# Patient Record
Sex: Male | Born: 1965
Health system: Southern US, Community
[De-identification: ages and names within clinical notes are randomized; demographics above are authoritative.]

## PROBLEM LIST (undated history)

## (undated) DIAGNOSIS — C801 Malignant (primary) neoplasm, unspecified: Secondary | ICD-10-CM

## (undated) DIAGNOSIS — K219 Gastro-esophageal reflux disease without esophagitis: Secondary | ICD-10-CM

## (undated) DIAGNOSIS — Z9889 Other specified postprocedural states: Secondary | ICD-10-CM

## (undated) DIAGNOSIS — E119 Type 2 diabetes mellitus without complications: Secondary | ICD-10-CM

## (undated) DIAGNOSIS — T7840XA Allergy, unspecified, initial encounter: Secondary | ICD-10-CM

## (undated) DIAGNOSIS — R112 Nausea with vomiting, unspecified: Secondary | ICD-10-CM

## (undated) HISTORY — PX: TONSILLECTOMY: SUR1361

## (undated) HISTORY — DX: Other specified postprocedural states: Z98.890

## (undated) HISTORY — DX: Type 2 diabetes mellitus without complications: E11.9

## (undated) HISTORY — DX: Malignant (primary) neoplasm, unspecified: C80.1

## (undated) HISTORY — DX: Nausea with vomiting, unspecified: R11.2

## (undated) HISTORY — PX: SINUS EXPLORATION: SHX5214

## (undated) HISTORY — DX: Allergy, unspecified, initial encounter: T78.40XA

---

## 2002-02-19 ENCOUNTER — Ambulatory Visit (HOSPITAL_BASED_OUTPATIENT_CLINIC_OR_DEPARTMENT_OTHER): Admission: RE | Admit: 2002-02-19 | Discharge: 2002-02-19 | Payer: Self-pay | Admitting: Otolaryngology

## 2003-04-19 ENCOUNTER — Ambulatory Visit (HOSPITAL_COMMUNITY): Admission: RE | Admit: 2003-04-19 | Discharge: 2003-04-19 | Payer: Self-pay | Admitting: Family Medicine

## 2003-04-19 ENCOUNTER — Encounter: Payer: Self-pay | Admitting: Family Medicine

## 2003-10-15 ENCOUNTER — Ambulatory Visit (HOSPITAL_COMMUNITY): Admission: RE | Admit: 2003-10-15 | Discharge: 2003-10-15 | Payer: Self-pay | Admitting: Family Medicine

## 2007-03-07 ENCOUNTER — Ambulatory Visit: Payer: Self-pay | Admitting: Internal Medicine

## 2007-06-03 ENCOUNTER — Ambulatory Visit (HOSPITAL_COMMUNITY): Admission: RE | Admit: 2007-06-03 | Discharge: 2007-06-03 | Payer: Self-pay | Admitting: Internal Medicine

## 2007-06-03 ENCOUNTER — Encounter (INDEPENDENT_AMBULATORY_CARE_PROVIDER_SITE_OTHER): Payer: Self-pay | Admitting: Interventional Radiology

## 2008-07-10 IMAGING — US US BIOPSY
1 series · 4 of 4 positions shown · non-contrast
Comparison: none

CLINICAL DATA: Elevated liver function tests.  Request has been made for random liver core biopsy.
ULTRASOUND-GUIDED RANDOM LIVER CORE BIOPSY ? 06/03/07: 
Procedure: The procedure in detail was discussed with the patient and his questions answered.  Potential complications including that of the risk of infection, bleeding, organ damage, and death were discussed with the patient?s apparent understanding.  Written consent was obtained. 
Ultrasound was used to mark an appropriate skin site. The patient was prepped and draped in the normal sterile fashion and 1% lidocaine was used for local anesthesia.  Through a 17-gauge guiding trocar, three passes were made into the right hepatic lobe with an 18-gauge biopsy needle.  The specimens were sent to the laboratory for further analysis.  Post-imaging of the liver revealed no evidence of bleeding or hematoma.  The patient appeared to tolerate the procedure well with no immediate complications. Cardiac and respiratory monitoring were provided by the radiology nurse throughout the procedure.  Moderate sedation in the form of 3 mg of Versed and 100 mcg of fentanyl was administered over a total sedation time of 15 minutes.

[Series 1: unknown · 0.33mm/px · 4 of 4 slices shown]
[im 1/4]
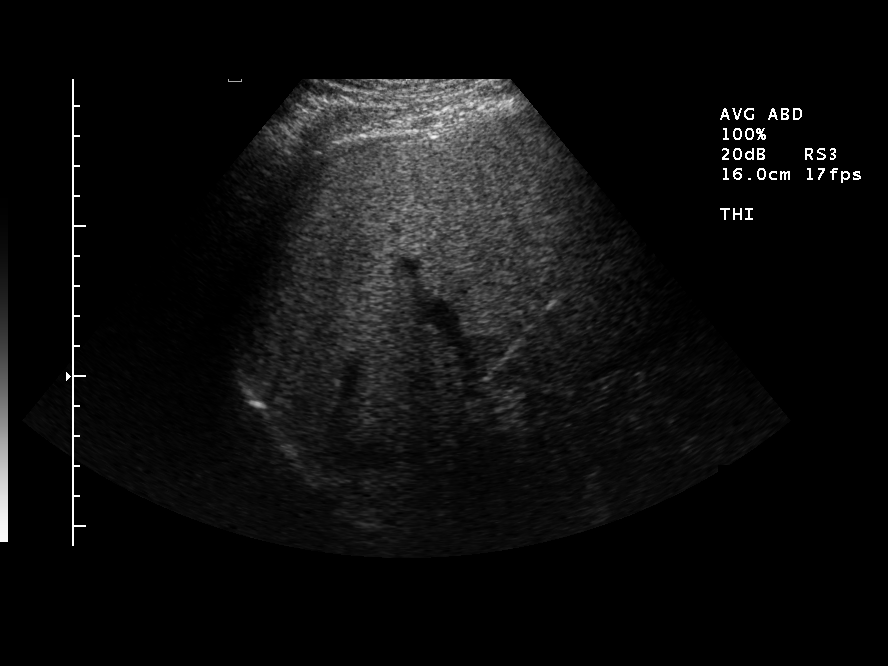
[im 2/4]
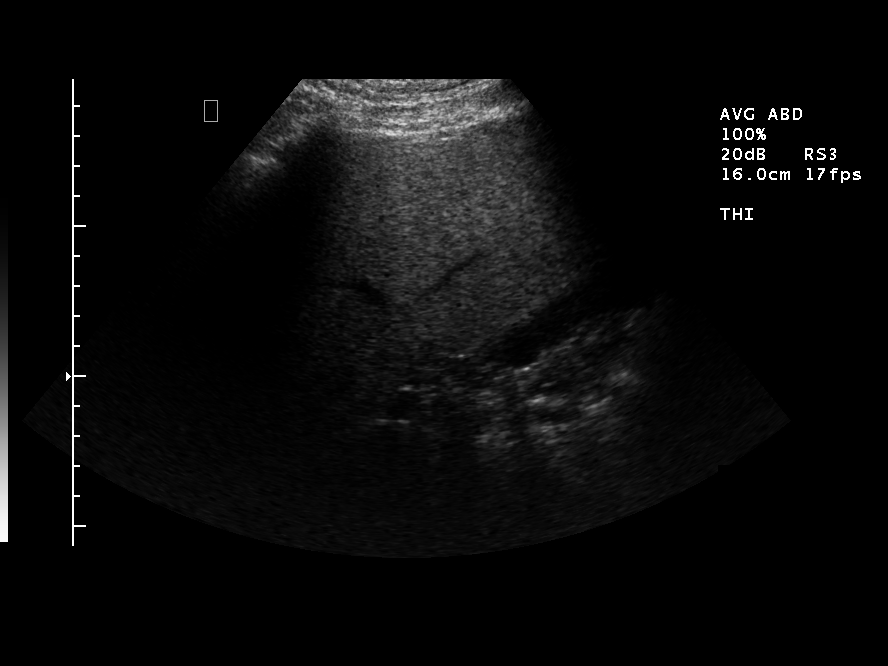
[im 3/4]
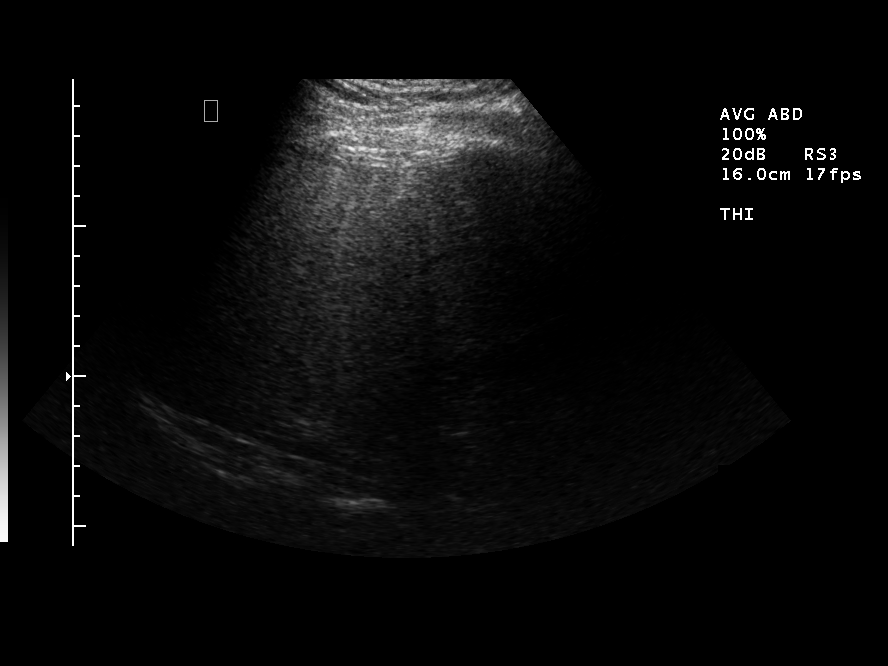
[im 4/4]
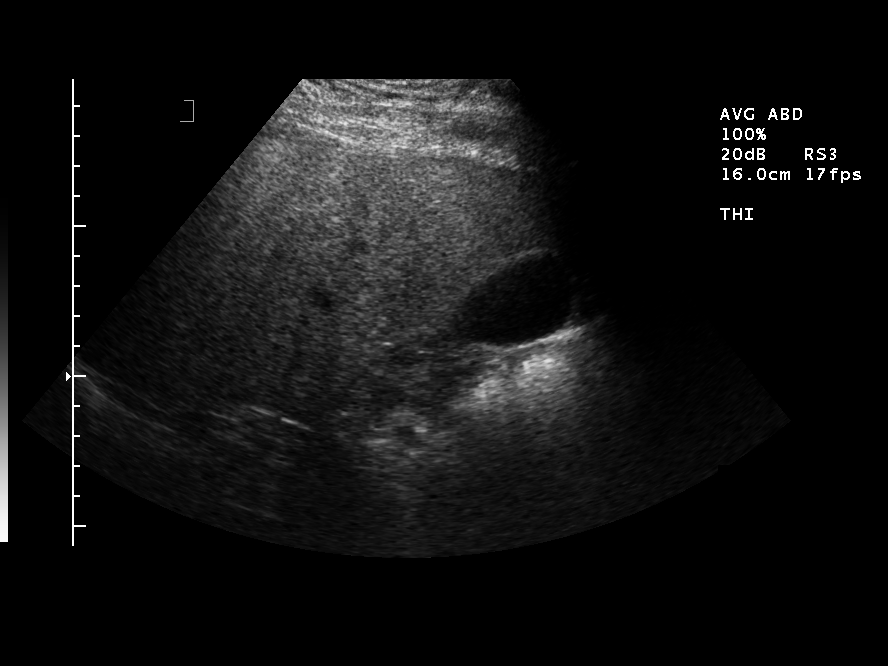

[4 of 4 positions shown; findings below may reference images not displayed]

IMPRESSION: Successful random liver core biopsy under ultrasound guidance.

## 2010-10-20 ENCOUNTER — Encounter: Payer: Self-pay | Admitting: Internal Medicine

## 2011-02-10 NOTE — Consult Note (Signed)
[name], Keith Lam               ACCOUNT NO.:  [id number]   MEDICAL RECORD NO.:  [id number]          PATIENT TYPE:  AMB   LOCATION:                                FACILITY:  [facility]   PHYSICIAN:  R. Roetta Sessions, M.D. DATE OF BIRTH:  [birth year]   DATE OF CONSULTATION:  DATE OF DISCHARGE:                                 CONSULTATION   REFERRING PHYSICIAN:  Patrica Duel, M.D. [facility] [place], [place]).   REASON FOR CONSULTATION:  Elevated liver enzymes.   HISTORY OF PRESENT ILLNESS:  Keith Lam is a very pleasant 45-  year-old Caucasian male sent over by Dr. Nobie Putnam and associates to  further evaluate elevated liver enzymes.  Keith Lam tells me  that he has had elevations in his liver enzymes going back five years.  He tells me every time he is put on a lipid lowering agent, they would  go up even higher and the lipid lowering agent is stopped.   From Feb 02, 2007, his ALT and AST were 61 and 40, respectively.  Alkaline phosphatase 82, total bilirubin 0.6, GGT was slightly up at  112.  Hepatitis B surface antigen and hepatitis C antibody both came  back negative.  He has not had any iron studies.   Keith Lam readily admits drinking 2-3 beers daily.  He has done so for  several years, may drink 5-6 each weekend day and tells me over the past  few months he has had a habit of going out with his friends and drinking  2-3 shots of hard liquor on top of the beer.  The reason for his recent  visit to Dr. Nobie Putnam was some retroxiphoid and right upper quadrant  pain he was having.  He stopped taking the shots and that pain has  subsided at least temporarily with a course of omeprazole 20 mg orally  daily.  He is not having much in the way of typical reflux symptoms. No  odynophagia, no dysphagia, no melena, no rectal bleeding.  He denies  abdominal pain currently.  He has not lost any weight.   There is no history of yellow jaundice.  There was no family history of  chronic liver disease with cirrhosis.  He was diagnosed with colorectal  cancer at age 12.  He does not use over-the-counter herbal remedies.  He  does not use acetaminophen products, when specifically questioned.   He had an ultrasound at [facility] which demonstrated a fatty  liver.   PAST MEDICAL HISTORY:  1. Elevated aminotransferases.  2. History of seasonal allergies.   PAST SURGICAL HISTORY:  1. Tonsillectomy.  2. Sinus surgery.   CURRENT MEDICATIONS:  1. Omeprazole 20 mg orally daily.  2. Allegra 180 mg daily.   ALLERGIES:  NO KNOWN DRUG ALLERGIES.   FAMILY HISTORY:  No chronic GI or liver illness aside from a second  degree relative (aunt) with colorectal cancer.   SOCIAL HISTORY:  Patient is married.  He has one child in good health.  He works in the [employer] as a Engineer, maintenance.  He does not  use  tobacco.  He consumes alcohol as outlined above.  No illicit drugs.   REVIEW OF SYSTEMS:  As in history of present illness, he has occasional  alternating constipation and diarrhea.  No melena, no rectal bleeding,  no yellow jaundice, clay colored stools, dark colored urine.   PHYSICAL EXAMINATION:  GENERAL APPEARANCE:  A 45 year old gentleman,  well groomed, resting comfortably.  VITAL SIGNS:  Weight 184.5, height 5 feet 9, temperature 98, blood  pressure 124/90, pulse 72.  SKIN:  Warm and dry.  He has some skin tanning.  HEENT:  He has a couple of small malar telangiectasias, no scleral  icterus.  Conjunctivae are pink.  Oral cavity no lesions.  CHEST:  Lungs are clear to auscultation.  CARDIOVASCULAR:  Regular rate and rhythm without murmurs, rubs, or  gallops.  ABDOMEN:  Nondistended.  Positive bowel sounds.  Soft and nontender  without appreciable mass or hepatosplenomegaly.  EXTREMITIES:  No edema.   ADDITIONAL LABORATORY DATA:  CBC was completely normal from Feb 02, 2007, white count 5, H&H 15.5 and 45.9, platelet count 199.  Chem-20  looked good  except for AST and ALT of 40 and 61, respectively.  Albumin  was 4.7.   IMPRESSION:  Keith Lam is a pleasant 45 year old Caucasian male  referred over for elevated aminotransferases.  I suspect almost  certainly, this noted elevation in his aminotransferases is related to  rather significant ongoing alcohol consumption.  By his history, he  gives a report of even a greater jump when Statin therapies are  employed.   I discussed with Keith Lam, the high likelihood the level of alcohol  consumption that is ongoing could well be harming his liver and  producing the elevated blood enzyme levels.   RECOMMENDATIONS:  Would like to retrieve the ultrasound report from  [facility] which I do not have for review at this time.  Will  repeat his LFTs, obtain a protime as a baseline at this time and will  also do iron studies to rule out the remote possibility of coexisting  iron overload.  I told Keith Lam assuming that his iron studies are  okay, I have recommended he completely refrain from all forms of alcohol  for the next six weeks and then we would repeat his liver enzymes.   If we could establish that his elevated liver enzymes are currently  related to alcohol consumption that would potentially allow [place] to  consider Statin therapy in the future, even allowing for some degree of  elevation in transaminases assuming alcohol would be out of the picture.   I would like to thank Dr. Patrica Duel for allowing me to see this very  nice gentleman today.      Keith Lam, M.D.  Electronically Signed     RMR/MEDQ  D:  03/07/2007  T:  03/07/2007  Job:  [id number]   cc:   Patrica Duel, M.D.  Fax: [phone]

## 2011-07-10 LAB — CBC
MCHC: 35.4
MCV: 90.7
Platelets: 205

## 2011-07-10 LAB — PROTIME-INR: Prothrombin Time: 12.8

## 2012-02-21 ENCOUNTER — Encounter: Payer: Self-pay | Admitting: Emergency Medicine

## 2012-02-21 ENCOUNTER — Emergency Department
Admission: EM | Admit: 2012-02-21 | Discharge: 2012-02-21 | Disposition: A | Discharge status: Home / Self Care | Payer: BC Managed Care – PPO | Source: Home / Self Care | Attending: Family Medicine | Admitting: Family Medicine

## 2012-02-21 DIAGNOSIS — J029 Acute pharyngitis, unspecified: Secondary | ICD-10-CM

## 2012-02-21 DIAGNOSIS — K120 Recurrent oral aphthae: Secondary | ICD-10-CM

## 2012-02-21 HISTORY — DX: Gastro-esophageal reflux disease without esophagitis: K21.9

## 2012-02-21 MED ORDER — MAGIC MOUTHWASH W/LIDOCAINE: ORAL | Status: DC

## 2012-02-21 MED ORDER — HYDROCODONE-ACETAMINOPHEN 5-500 MG PO TABS: ORAL_TABLET | ORAL | Status: DC

## 2012-02-21 NOTE — ED Provider Notes (Signed)
History     CSN: [id number]  Arrival date & time 02/21/12  1421   First MD Initiated Contact with Patient 02/21/12 1426      Chief Complaint  Patient presents with  . Sore Throat     HPI Comments: Patient complains of onset of a mild sore throat four days ago, with chills at night.  The sore throat gradually worsened and he visited an urgent care center two days ago where he was prescribed amoxicillin.  A rapid strep test was negative.  He continues to have a sore throat, which awakens him at night, and he has noticed two small ulcers on his uvula.   He feels generally well otherwise except for fatigue.                                                                                                                                                                                                                                                                                                                          The history is provided by the patient.    Past Medical History  Diagnosis Date  . Acid reflux     Past Surgical History  Procedure Date  . Sinus exploration     History reviewed. No pertinent family history.  History  Substance Use Topics  . Smoking status: Never Smoker   . Smokeless tobacco: Not on file  . Alcohol Use: Yes      Review of Systems + sore throat No cough No pleuritic pain No wheezing No nasal congestion No post-nasal drainage No sinus pain/pressure No itchy/red eyes ? earache No hemoptysis No SOB No fever, + chills No nausea No vomiting No abdominal pain No diarrhea No urinary symptoms No skin rashes + fatigue No myalgias No headache Used OTC meds without relief (ibuprofen) Allergies  Sulfa antibiotics  Home Medications   Current Outpatient Rx  Name Route Sig Dispense Refill  . AMOXICILLIN ER 775 MG PO TB24 Oral Take  1,500 mg by mouth 2 (two) times daily.    Marland Kitchen LORATADINE 10 MG PO TABS Oral Take 10 mg by mouth 2 (two) times  daily.    Marland Kitchen OMEPRAZOLE 20 MG PO CPDR Oral Take 20 mg by mouth daily.    Marland Kitchen MAGIC MOUTHWASH W/LIDOCAINE  Gargle 5mL four times daily (before meals and bedtime), then spit out. 140 mL 0  . HYDROCODONE-ACETAMINOPHEN 5-500 MG PO TABS  Take one or two tabs by mouth at bedtime as needed for pain 10 tablet 0    BP 130/86  Pulse 63  Temp(Src) 98.1 F (36.7 C) (Oral)  Resp 16  Ht 5\' 8"  (1.727 m)  Wt 177 lb (80.287 kg)  BMI 26.91 kg/m2  SpO2 98%  Physical Exam Nursing notes and Vital Signs reviewed. Appearance:  Patient appears healthy, stated age, and in no acute distress Eyes:  Pupils are equal, round, and reactive to light and accomodation.  Extraocular movement is intact.  Conjunctivae are not inflamed  Ears:  Canals normal.  Tympanic membranes normal.  Nose:  No turbinate congestion.  No sinus tenderness.   Pharynx:  There is erythema of the uvula with two small aphthous ulcers present measuring about 2mm dia. Neck:  Supple.  Tender shotty anterior/posterior nodes are palpated bilaterally  Lungs:  Clear to auscultation.  Breath sounds are equal.  Heart:  Regular rate and rhythm without murmurs, rubs, or gallops.  Abdomen:  Nontender without masses or hepatosplenomegaly.  Bowel sounds are present.  No CVA or flank tenderness.  Extremities:  No edema.  No calf tenderness Skin:  No rash present.   ED Course  Procedures none   Labs Reviewed  POCT RAPID STREP A (OFFICE) negative  STREP A DNA PROBE pending      1. Acute pharyngitis; appears viral  2. Aphthous ulcer of pharynx or hypopharynx       MDM   Throat culture pending. Continue amoxicillin. Rx for Lortab at bedtime.  Magic mouthwash four times daily. Followup with ENT if not improving.        Lattie Haw, MD 02/21/12 1700

## 2012-02-21 NOTE — ED Notes (Signed)
Sore throat x 4 days; seen in another urgent care 48 hours ago where rapid strep was negative, but was placed on Amoxicillin; no improvement.

## 2012-02-21 NOTE — ED Notes (Deleted)
Treated/evaluated here 2 days ago for congestion, cough and sore throat. Was told it was 'viral' and no meds.

## 2012-02-21 NOTE — Discharge Instructions (Signed)
May continue amoxicillin.   Viral Pharyngitis Viral pharyngitis is a viral infection that produces redness, pain, and swelling (inflammation) of the throat. It can spread from person to person (contagious). CAUSES Viral pharyngitis is caused by inhaling a large amount of certain germs called viruses. Many different viruses cause viral pharyngitis. SYMPTOMS Symptoms of viral pharyngitis include:  Sore throat.   Tiredness.   Stuffy nose.   Low-grade fever.   Congestion.   Cough.  TREATMENT Treatment includes rest, drinking plenty of fluids, and the use of over-the-counter medication (approved by your caregiver). HOME CARE INSTRUCTIONS   Drink enough fluids to keep your urine clear or pale yellow.   Eat soft, cold foods such as ice cream, frozen ice pops, or gelatin dessert.   Gargle with warm salt water (1 tsp salt per 1 qt of water).   If over age 60, throat lozenges may be used safely.   Only take over-the-counter or prescription medicines for pain, discomfort, or fever as directed by your caregiver. Do not take aspirin.  To help prevent spreading viral pharyngitis to others, avoid:  Mouth-to-mouth contact with others.   Sharing utensils for eating and drinking.   Coughing around others.  SEEK MEDICAL CARE IF:   You are better in a few days, then become worse.   You have a fever or pain not helped by pain medicines.   There are any other changes that concern you.  Document Released: 06/24/2005 Document Revised: 09/03/2011 Document Reviewed: 11/20/2010 [facility] Patient Information 2012 [place], [place].

## 2012-12-07 ENCOUNTER — Other Ambulatory Visit (HOSPITAL_COMMUNITY): Payer: Self-pay | Admitting: Family Medicine

## 2012-12-07 ENCOUNTER — Ambulatory Visit (HOSPITAL_COMMUNITY)
Admission: RE | Admit: 2012-12-07 | Discharge: 2012-12-07 | Disposition: A | Discharge status: Home / Self Care | Payer: BC Managed Care – PPO | Source: Ambulatory Visit | Attending: Family Medicine | Admitting: Family Medicine

## 2012-12-07 DIAGNOSIS — J189 Pneumonia, unspecified organism: Secondary | ICD-10-CM

## 2012-12-07 DIAGNOSIS — R05 Cough: Secondary | ICD-10-CM | POA: Insufficient documentation

## 2012-12-07 DIAGNOSIS — J069 Acute upper respiratory infection, unspecified: Secondary | ICD-10-CM | POA: Insufficient documentation

## 2012-12-07 DIAGNOSIS — R059 Cough, unspecified: Secondary | ICD-10-CM | POA: Insufficient documentation

## 2015-02-15 ENCOUNTER — Other Ambulatory Visit (HOSPITAL_COMMUNITY): Payer: Self-pay | Admitting: Family Medicine

## 2015-02-15 DIAGNOSIS — K219 Gastro-esophageal reflux disease without esophagitis: Secondary | ICD-10-CM

## 2015-02-15 DIAGNOSIS — R1011 Right upper quadrant pain: Secondary | ICD-10-CM

## 2015-02-19 ENCOUNTER — Ambulatory Visit (HOSPITAL_COMMUNITY): Admission: RE | Admit: 2015-02-19 | Payer: BLUE CROSS/BLUE SHIELD | Source: Ambulatory Visit

## 2015-03-07 ENCOUNTER — Encounter (INDEPENDENT_AMBULATORY_CARE_PROVIDER_SITE_OTHER): Payer: Self-pay | Admitting: *Deleted

## 2015-04-08 ENCOUNTER — Ambulatory Visit (INDEPENDENT_AMBULATORY_CARE_PROVIDER_SITE_OTHER): Payer: BLUE CROSS/BLUE SHIELD | Admitting: Internal Medicine

## 2015-04-25 ENCOUNTER — Ambulatory Visit (INDEPENDENT_AMBULATORY_CARE_PROVIDER_SITE_OTHER): Payer: BLUE CROSS/BLUE SHIELD | Admitting: Internal Medicine

## 2016-02-12 DIAGNOSIS — B078 Other viral warts: Secondary | ICD-10-CM | POA: Diagnosis not present

## 2016-02-12 DIAGNOSIS — D225 Melanocytic nevi of trunk: Secondary | ICD-10-CM | POA: Diagnosis not present

## 2016-03-24 DIAGNOSIS — Z008 Encounter for other general examination: Secondary | ICD-10-CM | POA: Diagnosis not present

## 2016-03-24 DIAGNOSIS — Z1389 Encounter for screening for other disorder: Secondary | ICD-10-CM | POA: Diagnosis not present

## 2016-03-24 DIAGNOSIS — R7301 Impaired fasting glucose: Secondary | ICD-10-CM | POA: Diagnosis not present

## 2016-03-24 DIAGNOSIS — E782 Mixed hyperlipidemia: Secondary | ICD-10-CM | POA: Diagnosis not present

## 2016-07-21 DIAGNOSIS — Z008 Encounter for other general examination: Secondary | ICD-10-CM | POA: Diagnosis not present

## 2016-07-21 DIAGNOSIS — E782 Mixed hyperlipidemia: Secondary | ICD-10-CM | POA: Diagnosis not present

## 2016-07-21 DIAGNOSIS — Z139 Encounter for screening, unspecified: Secondary | ICD-10-CM | POA: Diagnosis not present

## 2016-07-21 DIAGNOSIS — J309 Allergic rhinitis, unspecified: Secondary | ICD-10-CM | POA: Diagnosis not present

## 2016-08-11 DIAGNOSIS — E782 Mixed hyperlipidemia: Secondary | ICD-10-CM | POA: Diagnosis not present

## 2016-08-11 DIAGNOSIS — R7989 Other specified abnormal findings of blood chemistry: Secondary | ICD-10-CM | POA: Diagnosis not present

## 2016-08-18 DIAGNOSIS — J3489 Other specified disorders of nose and nasal sinuses: Secondary | ICD-10-CM | POA: Diagnosis not present

## 2016-08-18 DIAGNOSIS — B9789 Other viral agents as the cause of diseases classified elsewhere: Secondary | ICD-10-CM | POA: Diagnosis not present

## 2016-08-18 DIAGNOSIS — J069 Acute upper respiratory infection, unspecified: Secondary | ICD-10-CM | POA: Diagnosis not present

## 2016-12-17 DIAGNOSIS — R7301 Impaired fasting glucose: Secondary | ICD-10-CM | POA: Diagnosis not present

## 2016-12-17 DIAGNOSIS — Z719 Counseling, unspecified: Secondary | ICD-10-CM | POA: Diagnosis not present

## 2016-12-17 DIAGNOSIS — Z008 Encounter for other general examination: Secondary | ICD-10-CM | POA: Diagnosis not present

## 2016-12-17 DIAGNOSIS — E782 Mixed hyperlipidemia: Secondary | ICD-10-CM | POA: Diagnosis not present

## 2016-12-17 DIAGNOSIS — R7989 Other specified abnormal findings of blood chemistry: Secondary | ICD-10-CM | POA: Diagnosis not present

## 2017-01-04 DIAGNOSIS — E782 Mixed hyperlipidemia: Secondary | ICD-10-CM | POA: Diagnosis not present

## 2017-01-04 DIAGNOSIS — Z6827 Body mass index (BMI) 27.0-27.9, adult: Secondary | ICD-10-CM | POA: Diagnosis not present

## 2017-01-04 DIAGNOSIS — Z1389 Encounter for screening for other disorder: Secondary | ICD-10-CM | POA: Diagnosis not present

## 2017-01-04 DIAGNOSIS — R7309 Other abnormal glucose: Secondary | ICD-10-CM | POA: Diagnosis not present

## 2017-01-12 ENCOUNTER — Encounter: Payer: Self-pay | Admitting: Gastroenterology

## 2017-02-26 ENCOUNTER — Ambulatory Visit (AMBULATORY_SURGERY_CENTER): Payer: Self-pay

## 2017-02-26 VITALS — Ht 68.0 in | Wt 174.6 lb

## 2017-02-26 DIAGNOSIS — Z1211 Encounter for screening for malignant neoplasm of colon: Secondary | ICD-10-CM

## 2017-02-26 MED ORDER — SUPREP BOWEL PREP KIT 17.5-3.13-1.6 GM/177ML PO SOLN: 1.0000 | Freq: Once | ORAL | 0 refills | Status: AC

## 2017-02-26 NOTE — Progress Notes (Signed)
No allergies to eggs or soy No diet meds No home oxygen No past problems with anesthesia  Registered emmi 

## 2017-03-02 ENCOUNTER — Encounter: Payer: Self-pay | Admitting: Gastroenterology

## 2017-03-12 ENCOUNTER — Encounter: Payer: BLUE CROSS/BLUE SHIELD | Admitting: Gastroenterology

## 2017-04-02 DIAGNOSIS — Z1211 Encounter for screening for malignant neoplasm of colon: Secondary | ICD-10-CM | POA: Diagnosis not present

## 2017-04-06 DIAGNOSIS — R7309 Other abnormal glucose: Secondary | ICD-10-CM | POA: Diagnosis not present

## 2017-04-06 DIAGNOSIS — E663 Overweight: Secondary | ICD-10-CM | POA: Diagnosis not present

## 2017-04-06 DIAGNOSIS — Z6825 Body mass index (BMI) 25.0-25.9, adult: Secondary | ICD-10-CM | POA: Diagnosis not present

## 2017-04-06 DIAGNOSIS — E782 Mixed hyperlipidemia: Secondary | ICD-10-CM | POA: Diagnosis not present

## 2017-04-06 DIAGNOSIS — Z1389 Encounter for screening for other disorder: Secondary | ICD-10-CM | POA: Diagnosis not present

## 2017-05-02 DIAGNOSIS — S0181XA Laceration without foreign body of other part of head, initial encounter: Secondary | ICD-10-CM | POA: Diagnosis not present

## 2017-06-08 DIAGNOSIS — E782 Mixed hyperlipidemia: Secondary | ICD-10-CM | POA: Diagnosis not present

## 2017-06-08 DIAGNOSIS — Z139 Encounter for screening, unspecified: Secondary | ICD-10-CM | POA: Diagnosis not present

## 2017-06-08 DIAGNOSIS — R7301 Impaired fasting glucose: Secondary | ICD-10-CM | POA: Diagnosis not present

## 2017-07-08 DIAGNOSIS — Z008 Encounter for other general examination: Secondary | ICD-10-CM | POA: Diagnosis not present

## 2017-07-08 DIAGNOSIS — R7301 Impaired fasting glucose: Secondary | ICD-10-CM | POA: Diagnosis not present

## 2017-07-08 DIAGNOSIS — Z719 Counseling, unspecified: Secondary | ICD-10-CM | POA: Diagnosis not present

## 2017-07-08 DIAGNOSIS — R7989 Other specified abnormal findings of blood chemistry: Secondary | ICD-10-CM | POA: Diagnosis not present

## 2017-07-08 DIAGNOSIS — E782 Mixed hyperlipidemia: Secondary | ICD-10-CM | POA: Diagnosis not present

## 2017-08-03 DIAGNOSIS — R7301 Impaired fasting glucose: Secondary | ICD-10-CM | POA: Diagnosis not present

## 2017-08-03 DIAGNOSIS — E782 Mixed hyperlipidemia: Secondary | ICD-10-CM | POA: Diagnosis not present

## 2017-08-03 DIAGNOSIS — R7989 Other specified abnormal findings of blood chemistry: Secondary | ICD-10-CM | POA: Diagnosis not present

## 2017-08-12 DIAGNOSIS — Z008 Encounter for other general examination: Secondary | ICD-10-CM | POA: Diagnosis not present

## 2017-09-03 DIAGNOSIS — J40 Bronchitis, not specified as acute or chronic: Secondary | ICD-10-CM | POA: Diagnosis not present

## 2017-09-03 DIAGNOSIS — Z6828 Body mass index (BMI) 28.0-28.9, adult: Secondary | ICD-10-CM | POA: Diagnosis not present

## 2017-09-03 DIAGNOSIS — J329 Chronic sinusitis, unspecified: Secondary | ICD-10-CM | POA: Diagnosis not present

## 2017-11-30 DIAGNOSIS — R7989 Other specified abnormal findings of blood chemistry: Secondary | ICD-10-CM | POA: Diagnosis not present

## 2017-11-30 DIAGNOSIS — Z008 Encounter for other general examination: Secondary | ICD-10-CM | POA: Diagnosis not present

## 2017-11-30 DIAGNOSIS — E782 Mixed hyperlipidemia: Secondary | ICD-10-CM | POA: Diagnosis not present

## 2017-11-30 DIAGNOSIS — Z719 Counseling, unspecified: Secondary | ICD-10-CM | POA: Diagnosis not present

## 2018-03-09 DIAGNOSIS — E663 Overweight: Secondary | ICD-10-CM | POA: Diagnosis not present

## 2018-03-09 DIAGNOSIS — Z0000 Encounter for general adult medical examination without abnormal findings: Secondary | ICD-10-CM | POA: Diagnosis not present

## 2018-03-09 DIAGNOSIS — R7309 Other abnormal glucose: Secondary | ICD-10-CM | POA: Diagnosis not present

## 2018-03-09 DIAGNOSIS — R002 Palpitations: Secondary | ICD-10-CM | POA: Diagnosis not present

## 2018-03-09 DIAGNOSIS — K219 Gastro-esophageal reflux disease without esophagitis: Secondary | ICD-10-CM | POA: Diagnosis not present

## 2018-03-09 DIAGNOSIS — Z6827 Body mass index (BMI) 27.0-27.9, adult: Secondary | ICD-10-CM | POA: Diagnosis not present

## 2018-03-09 DIAGNOSIS — E782 Mixed hyperlipidemia: Secondary | ICD-10-CM | POA: Diagnosis not present

## 2018-03-09 DIAGNOSIS — Z1389 Encounter for screening for other disorder: Secondary | ICD-10-CM | POA: Diagnosis not present

## 2018-03-09 DIAGNOSIS — Z23 Encounter for immunization: Secondary | ICD-10-CM | POA: Diagnosis not present

## 2018-03-15 DIAGNOSIS — E782 Mixed hyperlipidemia: Secondary | ICD-10-CM | POA: Diagnosis not present

## 2018-03-15 DIAGNOSIS — R7989 Other specified abnormal findings of blood chemistry: Secondary | ICD-10-CM | POA: Diagnosis not present

## 2018-03-15 DIAGNOSIS — Z008 Encounter for other general examination: Secondary | ICD-10-CM | POA: Diagnosis not present

## 2018-03-15 DIAGNOSIS — Z719 Counseling, unspecified: Secondary | ICD-10-CM | POA: Diagnosis not present

## 2018-03-24 DIAGNOSIS — A059 Bacterial foodborne intoxication, unspecified: Secondary | ICD-10-CM | POA: Diagnosis not present

## 2018-03-24 DIAGNOSIS — Z6827 Body mass index (BMI) 27.0-27.9, adult: Secondary | ICD-10-CM | POA: Diagnosis not present

## 2018-03-24 DIAGNOSIS — E663 Overweight: Secondary | ICD-10-CM | POA: Diagnosis not present

## 2018-04-15 DIAGNOSIS — S93402A Sprain of unspecified ligament of left ankle, initial encounter: Secondary | ICD-10-CM | POA: Diagnosis not present

## 2018-07-11 DIAGNOSIS — Z008 Encounter for other general examination: Secondary | ICD-10-CM | POA: Diagnosis not present

## 2018-08-08 DIAGNOSIS — R52 Pain, unspecified: Secondary | ICD-10-CM | POA: Diagnosis not present

## 2018-08-08 DIAGNOSIS — W57XXXA Bitten or stung by nonvenomous insect and other nonvenomous arthropods, initial encounter: Secondary | ICD-10-CM | POA: Diagnosis not present

## 2018-08-24 DIAGNOSIS — R21 Rash and other nonspecific skin eruption: Secondary | ICD-10-CM | POA: Diagnosis not present

## 2018-08-24 DIAGNOSIS — Z1389 Encounter for screening for other disorder: Secondary | ICD-10-CM | POA: Diagnosis not present

## 2018-08-24 DIAGNOSIS — Z6827 Body mass index (BMI) 27.0-27.9, adult: Secondary | ICD-10-CM | POA: Diagnosis not present

## 2018-08-24 DIAGNOSIS — E663 Overweight: Secondary | ICD-10-CM | POA: Diagnosis not present

## 2018-08-24 DIAGNOSIS — T07XXXA Unspecified multiple injuries, initial encounter: Secondary | ICD-10-CM | POA: Diagnosis not present

## 2018-08-24 DIAGNOSIS — Z23 Encounter for immunization: Secondary | ICD-10-CM | POA: Diagnosis not present

## 2019-05-22 DIAGNOSIS — K219 Gastro-esophageal reflux disease without esophagitis: Secondary | ICD-10-CM | POA: Diagnosis not present

## 2019-05-22 DIAGNOSIS — R7309 Other abnormal glucose: Secondary | ICD-10-CM | POA: Diagnosis not present

## 2019-05-22 DIAGNOSIS — E782 Mixed hyperlipidemia: Secondary | ICD-10-CM | POA: Diagnosis not present

## 2019-05-24 DIAGNOSIS — Z1389 Encounter for screening for other disorder: Secondary | ICD-10-CM | POA: Diagnosis not present

## 2019-05-24 DIAGNOSIS — E1165 Type 2 diabetes mellitus with hyperglycemia: Secondary | ICD-10-CM | POA: Diagnosis not present

## 2019-05-24 DIAGNOSIS — E7849 Other hyperlipidemia: Secondary | ICD-10-CM | POA: Diagnosis not present

## 2019-05-24 DIAGNOSIS — R7309 Other abnormal glucose: Secondary | ICD-10-CM | POA: Diagnosis not present

## 2019-05-24 DIAGNOSIS — Z0001 Encounter for general adult medical examination with abnormal findings: Secondary | ICD-10-CM | POA: Diagnosis not present

## 2019-05-24 DIAGNOSIS — Z6826 Body mass index (BMI) 26.0-26.9, adult: Secondary | ICD-10-CM | POA: Diagnosis not present

## 2019-05-24 DIAGNOSIS — R1011 Right upper quadrant pain: Secondary | ICD-10-CM | POA: Diagnosis not present

## 2019-05-25 ENCOUNTER — Other Ambulatory Visit (HOSPITAL_COMMUNITY): Payer: Self-pay | Admitting: Family Medicine

## 2019-05-25 ENCOUNTER — Other Ambulatory Visit: Payer: Self-pay | Admitting: Family Medicine

## 2019-05-25 DIAGNOSIS — R1011 Right upper quadrant pain: Secondary | ICD-10-CM

## 2019-06-02 ENCOUNTER — Ambulatory Visit (HOSPITAL_COMMUNITY)
Admission: RE | Admit: 2019-06-02 | Discharge: 2019-06-02 | Disposition: A | Discharge status: Home / Self Care | Payer: BC Managed Care – PPO | Source: Ambulatory Visit | Attending: Family Medicine | Admitting: Family Medicine

## 2019-06-02 ENCOUNTER — Other Ambulatory Visit: Payer: Self-pay

## 2019-06-02 DIAGNOSIS — R1011 Right upper quadrant pain: Secondary | ICD-10-CM

## 2019-07-10 DIAGNOSIS — Z23 Encounter for immunization: Secondary | ICD-10-CM | POA: Diagnosis not present

## 2019-07-14 ENCOUNTER — Encounter: Payer: Self-pay | Admitting: Internal Medicine

## 2019-08-21 DIAGNOSIS — J329 Chronic sinusitis, unspecified: Secondary | ICD-10-CM | POA: Diagnosis not present

## 2019-11-15 DIAGNOSIS — D225 Melanocytic nevi of trunk: Secondary | ICD-10-CM | POA: Diagnosis not present

## 2019-11-15 DIAGNOSIS — Z1283 Encounter for screening for malignant neoplasm of skin: Secondary | ICD-10-CM | POA: Diagnosis not present

## 2019-11-15 DIAGNOSIS — D044 Carcinoma in situ of skin of scalp and neck: Secondary | ICD-10-CM | POA: Diagnosis not present

## 2019-12-01 ENCOUNTER — Encounter: Payer: Self-pay | Admitting: Gastroenterology

## 2019-12-07 ENCOUNTER — Ambulatory Visit: Payer: Self-pay | Attending: Internal Medicine

## 2019-12-07 DIAGNOSIS — Z23 Encounter for immunization: Secondary | ICD-10-CM

## 2019-12-07 NOTE — Progress Notes (Signed)
   Covid-19 Vaccination Clinic  Name:  Keith Lam    MRN: [id number] DOB: 02-26-1966  12/07/2019  [name] was observed post Covid-19 immunization for 15 minutes without incident. He was provided with Vaccine Information Sheet and instruction to access the V-Safe system.   [name] was instructed to call 911 with any severe reactions post vaccine: Marland Kitchen Difficulty breathing  . Swelling of face and throat  . A fast heartbeat  . A bad rash all over body  . Dizziness and weakness   Immunizations Administered    Name Date Dose VIS Date Route   Moderna COVID-19 Vaccine 12/07/2019 10:07 AM 0.5 mL 08/29/2019 Intramuscular   Manufacturer: Moderna   Lot: [id number]   [place]:[id number]

## 2019-12-21 DIAGNOSIS — Z85828 Personal history of other malignant neoplasm of skin: Secondary | ICD-10-CM | POA: Diagnosis not present

## 2019-12-21 DIAGNOSIS — L928 Other granulomatous disorders of the skin and subcutaneous tissue: Secondary | ICD-10-CM | POA: Diagnosis not present

## 2019-12-21 DIAGNOSIS — Z08 Encounter for follow-up examination after completed treatment for malignant neoplasm: Secondary | ICD-10-CM | POA: Diagnosis not present

## 2020-01-03 DIAGNOSIS — E119 Type 2 diabetes mellitus without complications: Secondary | ICD-10-CM | POA: Insufficient documentation

## 2020-01-03 DIAGNOSIS — K219 Gastro-esophageal reflux disease without esophagitis: Secondary | ICD-10-CM | POA: Insufficient documentation

## 2020-01-09 ENCOUNTER — Ambulatory Visit: Payer: BC Managed Care – PPO | Attending: Internal Medicine

## 2020-01-09 DIAGNOSIS — Z23 Encounter for immunization: Secondary | ICD-10-CM

## 2020-01-09 NOTE — Progress Notes (Signed)
   Covid-19 Vaccination Clinic  Name:  Keith Lam    MRN: [id number] DOB: 10/29/1965  01/09/2020  Keith Lam was observed post Covid-19 immunization for 15 minutes without incident. He was provided with Vaccine Information Sheet and instruction to access the V-Safe system.   Keith Lam was instructed to call 911 with any severe reactions post vaccine: Marland Kitchen Difficulty breathing  . Swelling of face and throat  . A fast heartbeat  . A bad rash all over body  . Dizziness and weakness   Immunizations Administered    Name Date Dose VIS Date Route   Moderna COVID-19 Vaccine 01/09/2020  8:54 AM 0.5 mL 08/29/2019 Intramuscular   Manufacturer: Moderna   Lot: [id number]   [id number]

## 2020-01-12 ENCOUNTER — Ambulatory Visit (AMBULATORY_SURGERY_CENTER): Payer: Self-pay | Admitting: *Deleted

## 2020-01-12 ENCOUNTER — Other Ambulatory Visit: Payer: Self-pay

## 2020-01-12 VITALS — Temp 96.9°F | Ht 69.0 in | Wt 179.6 lb

## 2020-01-12 DIAGNOSIS — E119 Type 2 diabetes mellitus without complications: Secondary | ICD-10-CM

## 2020-01-12 DIAGNOSIS — Z1211 Encounter for screening for malignant neoplasm of colon: Secondary | ICD-10-CM

## 2020-01-12 MED ORDER — SUTAB 1479-225-188 MG PO TABS: 24.0000 | ORAL_TABLET | ORAL | 0 refills | Status: DC

## 2020-01-12 NOTE — Progress Notes (Signed)
01-09-20 completed covid vaccines  No egg or soy allergy known to patient   issues with past sedation with any surgeries  or procedures- PONV with deviated septum surgery , no intubation problems  No diet pills per patient No home 02 use per patient  No blood thinners per patient  Pt denies issues with constipation  No A fib or A flutter  EMMI video sent to pt's e mail   Due to the COVID-19 pandemic we are asking patients to follow these guidelines. Please only bring one care partner. Please be aware that your care partner may wait in the car in the parking lot or if they feel like they will be too hot to wait in the car, they may wait in the lobby on the 4th floor. All care partners are required to wear a mask the entire time (we do not have any that we can provide them), they need to practice social distancing, and we will do a Covid check for all patient's and care partners when you arrive. Also we will check their temperature and your temperature. If the care partner waits in their car they need to stay in the parking lot the entire time and we will call them on their cell phone when the patient is ready for discharge so they can bring the car to the front of the building. Also all patient's will need to wear a mask into building  Sutab Code and coupon .

## 2020-01-22 ENCOUNTER — Telehealth: Payer: Self-pay | Admitting: Gastroenterology

## 2020-01-22 ENCOUNTER — Telehealth: Payer: Self-pay | Admitting: *Deleted

## 2020-01-22 MED ORDER — SUTAB 1479-225-188 MG PO TABS: 24.0000 | ORAL_TABLET | ORAL | 0 refills | Status: DC

## 2020-01-22 NOTE — Telephone Encounter (Signed)
Resent SUtab script to pharamcy as per pt's request  Lelan Pons PV

## 2020-01-22 NOTE — Telephone Encounter (Signed)
Called in [place] with coupon, Confirmed it was $40 co-pay.  Patient aware that prescription will be ready  Tomorrow for pick-up ( had to order)

## 2020-01-24 ENCOUNTER — Encounter: Payer: Self-pay | Admitting: Gastroenterology

## 2020-01-26 ENCOUNTER — Ambulatory Visit (AMBULATORY_SURGERY_CENTER): Payer: BC Managed Care – PPO | Admitting: Gastroenterology

## 2020-01-26 ENCOUNTER — Encounter: Payer: Self-pay | Admitting: Gastroenterology

## 2020-01-26 ENCOUNTER — Other Ambulatory Visit: Payer: Self-pay

## 2020-01-26 VITALS — BP 120/82 | HR 61 | Temp 97.0°F | Resp 20 | Ht 69.0 in | Wt 179.6 lb

## 2020-01-26 DIAGNOSIS — D125 Benign neoplasm of sigmoid colon: Secondary | ICD-10-CM | POA: Diagnosis not present

## 2020-01-26 DIAGNOSIS — D123 Benign neoplasm of transverse colon: Secondary | ICD-10-CM

## 2020-01-26 DIAGNOSIS — D124 Benign neoplasm of descending colon: Secondary | ICD-10-CM

## 2020-01-26 DIAGNOSIS — D120 Benign neoplasm of cecum: Secondary | ICD-10-CM | POA: Diagnosis not present

## 2020-01-26 DIAGNOSIS — Z1211 Encounter for screening for malignant neoplasm of colon: Secondary | ICD-10-CM

## 2020-01-26 DIAGNOSIS — D122 Benign neoplasm of ascending colon: Secondary | ICD-10-CM | POA: Diagnosis not present

## 2020-01-26 HISTORY — PX: COLONOSCOPY: SHX174

## 2020-01-26 MED ORDER — SODIUM CHLORIDE 0.9 % IV SOLN: 500.0000 mL | Freq: Once | INTRAVENOUS | Status: DC

## 2020-01-26 NOTE — Progress Notes (Signed)
To PACU, VSS. Report to [email or web address] 

## 2020-01-26 NOTE — Patient Instructions (Signed)
YOU HAD AN ENDOSCOPIC PROCEDURE TODAY AT THE [facility]:   Refer to the procedure report that was given to you for any specific questions about what was found during the examination.  If the procedure report does not answer your questions, please call your gastroenterologist to clarify.  If you requested that your care partner not be given the details of your procedure findings, then the procedure report has been included in a sealed envelope for you to review at your convenience later.  YOU SHOULD EXPECT: Some feelings of bloating in the abdomen. Passage of more gas than usual.  Walking can help get rid of the air that was put into your GI tract during the procedure and reduce the bloating. If you had a lower endoscopy (such as a colonoscopy or flexible sigmoidoscopy) you may notice spotting of blood in your stool or on the toilet paper. If you underwent a bowel prep for your procedure, you may not have a normal bowel movement for a few days.  Please Note:  You might notice some irritation and congestion in your nose or some drainage.  This is from the oxygen used during your procedure.  There is no need for concern and it should clear up in a day or so.  SYMPTOMS TO REPORT IMMEDIATELY:   Following lower endoscopy (colonoscopy or flexible sigmoidoscopy):  Excessive amounts of blood in the stool  Significant tenderness or worsening of abdominal pains  Swelling of the abdomen that is new, acute  Fever of 100F or higher  For urgent or emergent issues, a gastroenterologist can be reached at any hour by calling [phone]. Do not use MyChart messaging for urgent concerns.    DIET:  We do recommend a small meal at first, but then you may proceed to your regular diet.  Drink plenty of fluids but you should avoid alcoholic beverages for 24 hours.  ACTIVITY:  You should plan to take it easy for the rest of today and you should NOT DRIVE or use heavy machinery until tomorrow (because  of the sedation medicines used during the test).    FOLLOW UP: Our staff will call the number listed on your records 48-72 hours following your procedure to check on you and address any questions or concerns that you may have regarding the information given to you following your procedure. If we do not reach you, we will leave a message.  We will attempt to reach you two times.  During this call, we will ask if you have developed any symptoms of COVID 19. If you develop any symptoms (ie: fever, flu-like symptoms, shortness of breath, cough etc.) before then, please call [phone].  If you test positive for Covid 19 in the 2 weeks post procedure, please call and report this information to us.    If any biopsies were taken you will be contacted by phone or by letter within the next 1-3 weeks.  Please call us at [phone] if you have not heard about the biopsies in 3 weeks.    SIGNATURES/CONFIDENTIALITY: You and/or your care partner have signed paperwork which will be entered into your electronic medical record.  These signatures attest to the fact that that the information above on your After Visit Summary has been reviewed and is understood.  Full responsibility of the confidentiality of this discharge information lies with you and/or your care-partner. 

## 2020-01-26 NOTE — Op Note (Signed)
[place] Patient Name: Keith Lam Procedure Date: 01/26/2020 11:46 AM MRN: [id number] Endoscopist: Remo Lipps P. Havery Moros , MD Age: 54 Referring MD:  Date of Birth: 20-Mar-1966 Gender: Male Account #: [id number] Procedure:                Colonoscopy Indications:              Screening for colorectal malignant neoplasm, This                            is the patient's first colonoscopy Medicines:                Monitored Anesthesia Care Procedure:                Pre-Anesthesia Assessment:                           - Prior to the procedure, a History and Physical                            was performed, and patient medications and                            allergies were reviewed. The patient's tolerance of                            previous anesthesia was also reviewed. The risks                            and benefits of the procedure and the sedation                            options and risks were discussed with the patient.                            All questions were answered, and informed consent                            was obtained. Prior Anticoagulants: The patient has                            taken no previous anticoagulant or antiplatelet                            agents. ASA Grade Assessment: II - A patient with                            mild systemic disease. After reviewing the risks                            and benefits, the patient was deemed in                            satisfactory condition to undergo the procedure.  After obtaining informed consent, the colonoscope                            was passed under direct vision. Throughout the                            procedure, the patient's blood pressure, pulse, and                            oxygen saturations were monitored continuously. The                            Colonoscope was introduced through the anus and                            advanced to the the  cecum, identified by                            appendiceal orifice and ileocecal valve. The                            colonoscopy was performed without difficulty. The                            patient tolerated the procedure well. The quality                            of the bowel preparation was adequate. The                            ileocecal valve, appendiceal orifice, and rectum                            were photographed. Scope In: 11:51:37 AM Scope Out: 12:14:30 PM Scope Withdrawal Time: 0 hours 18 minutes 22 seconds  Total Procedure Duration: 0 hours 22 minutes 53 seconds  Findings:                 The perianal and digital rectal examinations were                            normal.                           A 4 mm polyp was found in the cecum near the AO.                            The polyp was flat. The polyp was removed with a                            cold snare. Resection and retrieval were complete.                           A 3 mm polyp was found in the ascending colon. The  polyp was sessile. The polyp was removed with a                            cold snare. Resection and retrieval were complete.                           A 4 mm polyp was found in the transverse colon. The                            polyp was flat. The polyp was removed with a cold                            snare. Resection and retrieval were complete.                           A 4 mm polyp was found in the descending colon. The                            polyp was sessile. The polyp was removed with a                            cold snare. Resection and retrieval were complete.                           Two sessile polyps were found in the sigmoid colon.                            The polyps were 4 to 5 mm in size. These polyps                            were removed with a cold snare. Resection and                            retrieval were complete.                            Multiple medium-mouthed diverticula were found in                            the sigmoid colon.                           Internal hemorrhoids were found during retroflexion.                           The exam was otherwise without abnormality. Complications:            No immediate complications. Estimated blood loss:                            Minimal. Estimated Blood Loss:     Estimated blood loss was minimal. Impression:               - One 4 mm polyp  in the cecum, removed with a cold                            snare. Resected and retrieved.                           - One 3 mm polyp in the ascending colon, removed                            with a cold snare. Resected and retrieved.                           - One 4 mm polyp in the transverse colon, removed                            with a cold snare. Resected and retrieved.                           - One 4 mm polyp in the descending colon, removed                            with a cold snare. Resected and retrieved.                           - Two 4 to 5 mm polyps in the sigmoid colon,                            removed with a cold snare. Resected and retrieved.                           - Diverticulosis in the sigmoid colon.                           - Internal hemorrhoids.                           - The examination was otherwise normal. Recommendation:           - Patient has a contact number available for                            emergencies. The signs and symptoms of potential                            delayed complications were discussed with the                            patient. Return to normal activities tomorrow.                            Written discharge instructions were provided to the                            patient.                           -  Resume previous diet.                           - Continue present medications.                           - Await pathology results. Remo Lipps P. Azahel Belcastro,  MD 01/26/2020 12:19:40 PM This report has been signed electronically.

## 2020-01-26 NOTE — Progress Notes (Signed)
Pt's states no medical or surgical changes since previsit or office visit.  Vitals CW Temp LC

## 2020-01-30 ENCOUNTER — Telehealth: Payer: Self-pay

## 2020-01-30 NOTE — Telephone Encounter (Signed)
LVM

## 2020-01-30 NOTE — Telephone Encounter (Signed)
First post procedure follow up call, no answer 

## 2020-02-27 DIAGNOSIS — J019 Acute sinusitis, unspecified: Secondary | ICD-10-CM | POA: Diagnosis not present

## 2020-07-09 IMAGING — US US ABDOMEN COMPLETE
1 series · 14 of 25 positions shown · non-contrast
Comparison: None.

CLINICAL DATA: Right upper quadrant pain

EXAM:
ABDOMEN ULTRASOUND COMPLETE

[Series 1: us abdomen complete · 0.19mm/px · 14 of 138 slices shown]
[im 1/138]
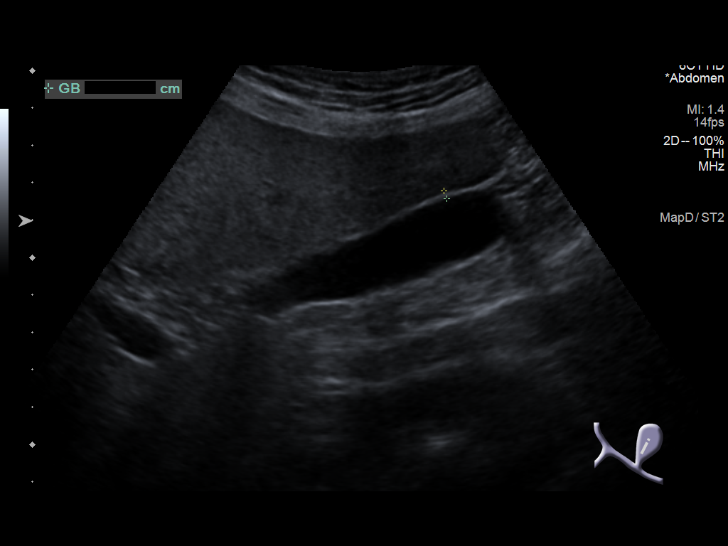
[im 12/138]
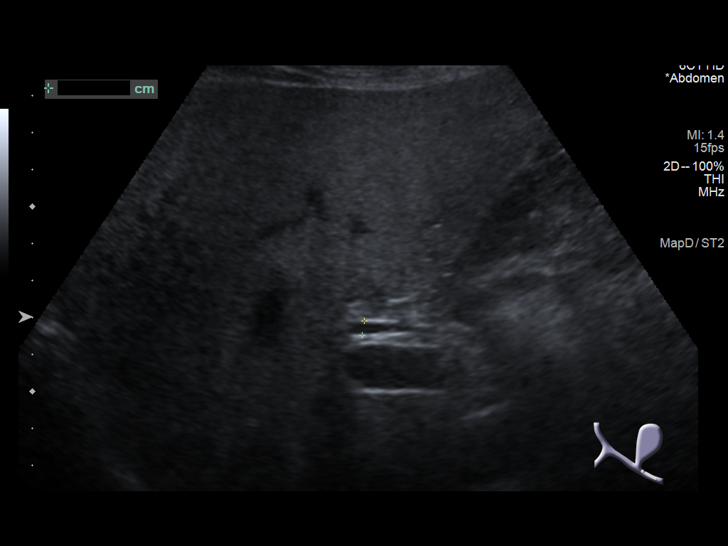
[im 23/138]
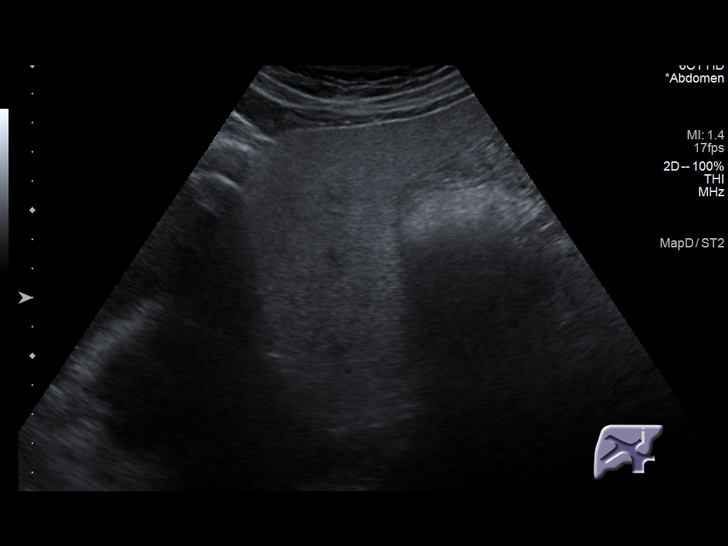
[im 35/138]
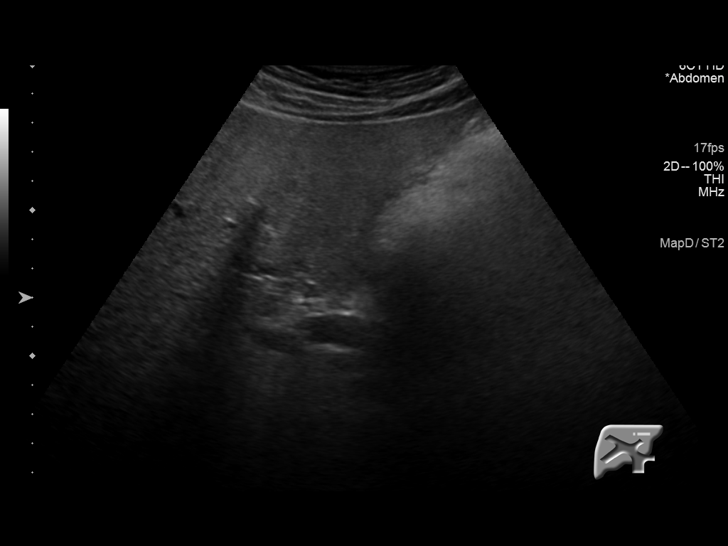
[im 46/138]
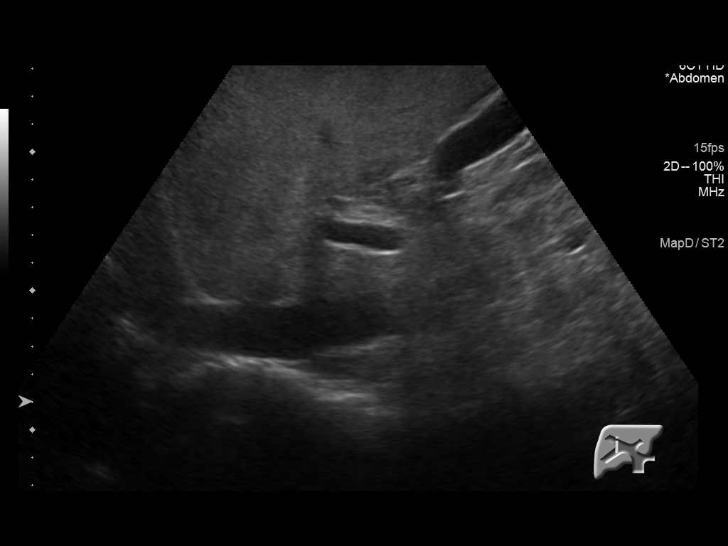
[im 52/138]
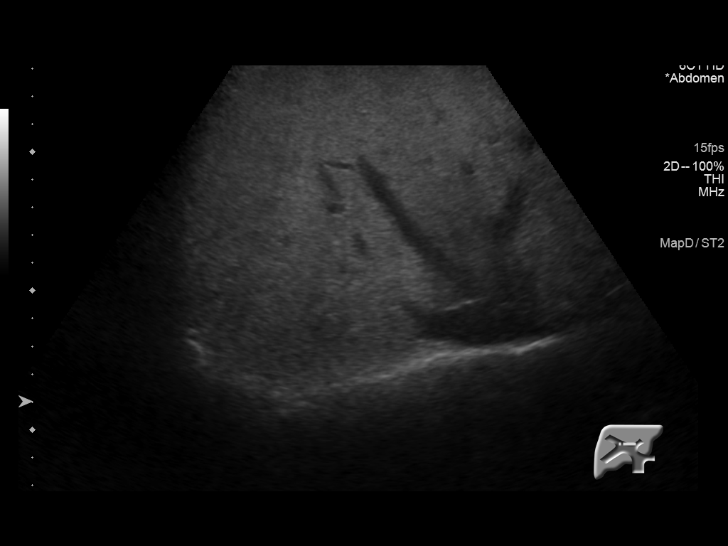
[im 63/138]
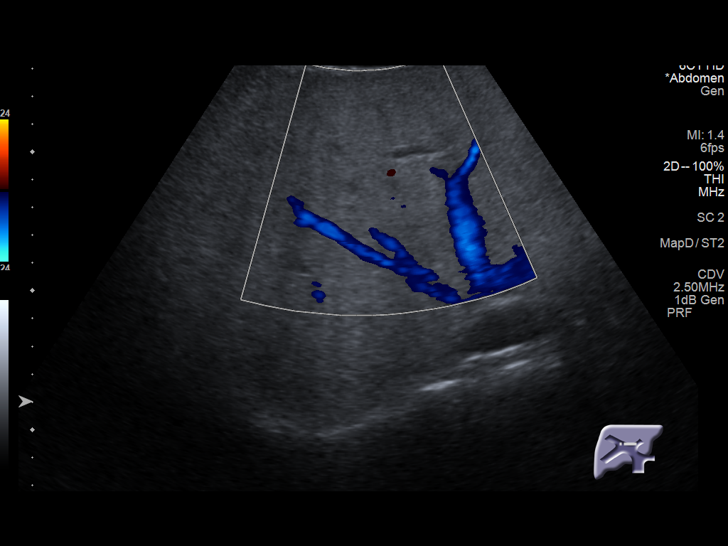
[im 75/138]
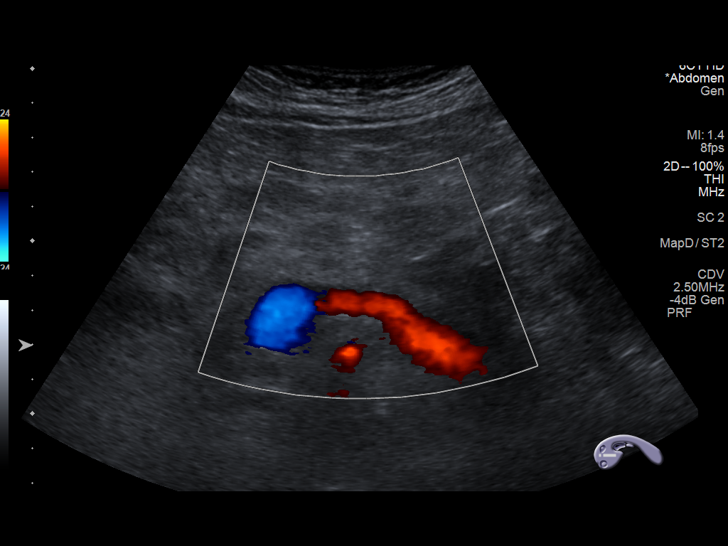
[im 86/138]
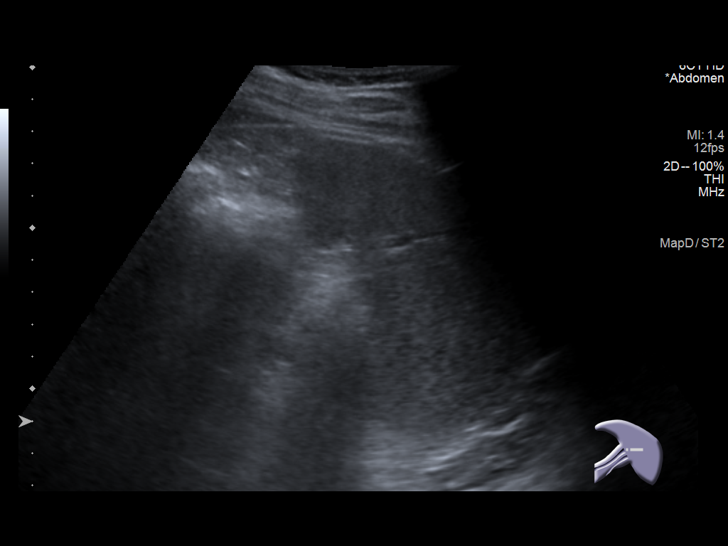
[im 92/138]
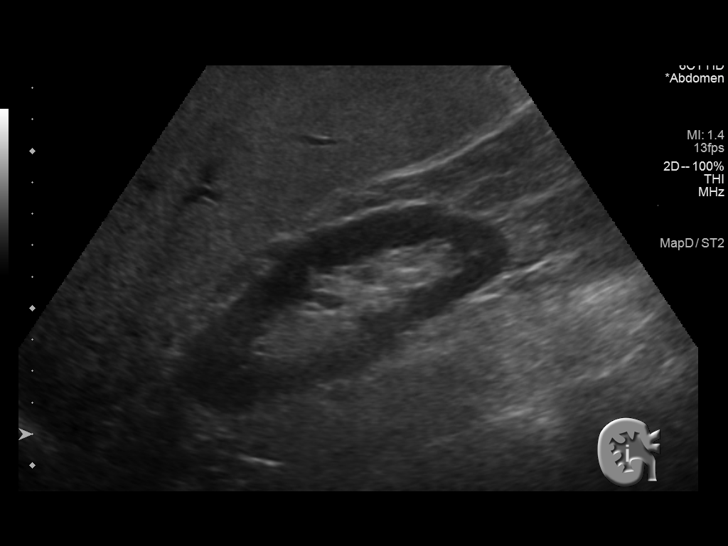
[im 103/138]
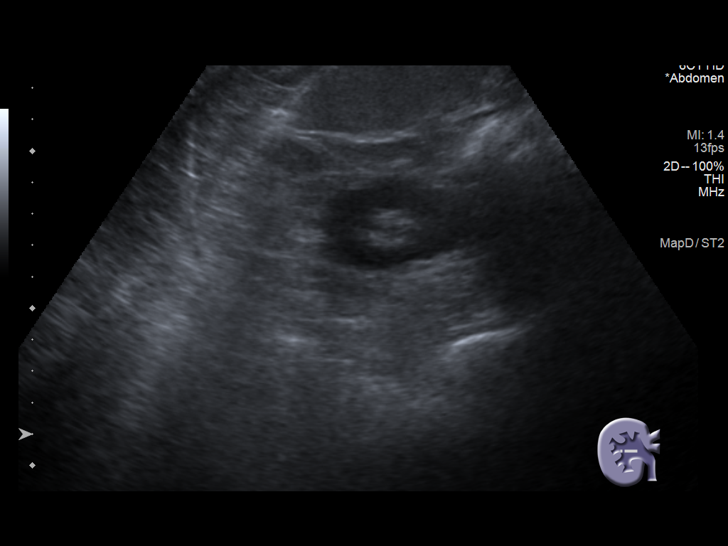
[im 115/138]
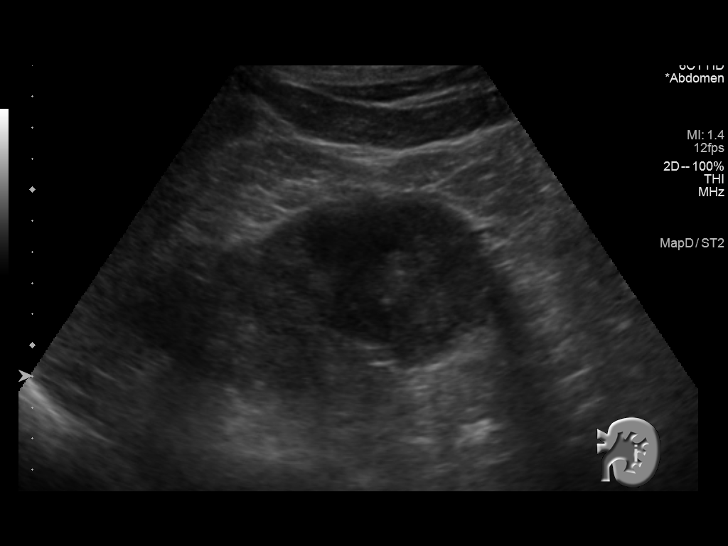
[im 126/138]
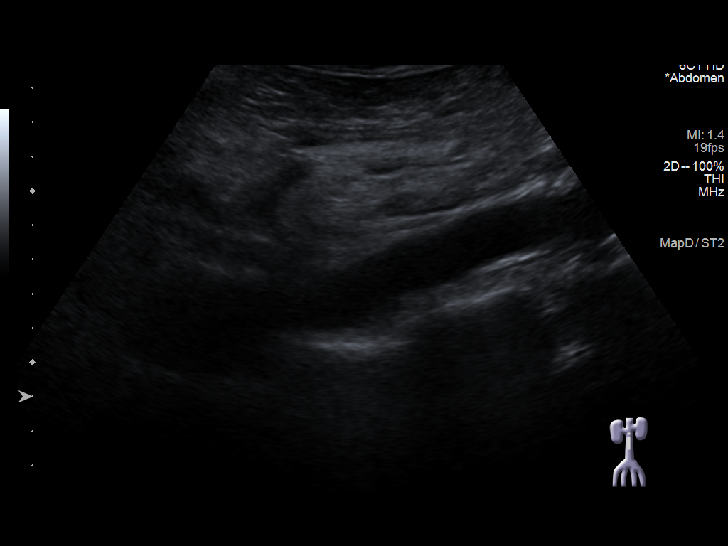
[im 138/138]
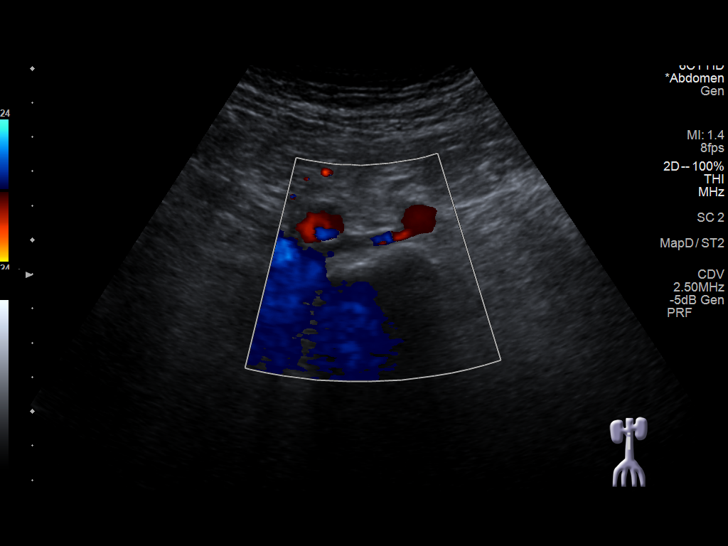

[14 of 25 positions shown; findings below may reference images not displayed]

FINDINGS: Gallbladder: No gallstones or wall thickening visualized. No
sonographic Murphy sign noted by sonographer.

Common bile duct: Diameter: 3.9 mm

Liver: Hyperechoic liver parenchyma without focal lesion. Portal
vein is patent on color Doppler imaging with normal direction of
blood flow towards the liver.

IVC: No abnormality visualized.

Pancreas: Visualized portion unremarkable.

Spleen: Size and appearance within normal limits.

Right Kidney: Length: 11.5 cm. Echogenicity within normal limits. No
mass or hydronephrosis visualized.

Left Kidney: Length: 12.7 cm. Echogenicity within normal limits. No
mass or hydronephrosis visualized.

Abdominal aorta: No aneurysm visualized.

Other findings: None.
IMPRESSION: Negative for gallstones or biliary dilatation

Hyperechoic liver parenchyma compatible with fatty infiltration.

## 2020-09-02 DIAGNOSIS — M79672 Pain in left foot: Secondary | ICD-10-CM | POA: Diagnosis not present

## 2020-09-02 DIAGNOSIS — M67472 Ganglion, left ankle and foot: Secondary | ICD-10-CM | POA: Diagnosis not present

## 2021-11-11 DIAGNOSIS — Z6826 Body mass index (BMI) 26.0-26.9, adult: Secondary | ICD-10-CM | POA: Diagnosis not present

## 2021-11-11 DIAGNOSIS — E782 Mixed hyperlipidemia: Secondary | ICD-10-CM | POA: Diagnosis not present

## 2021-11-11 DIAGNOSIS — E7849 Other hyperlipidemia: Secondary | ICD-10-CM | POA: Diagnosis not present

## 2021-11-11 DIAGNOSIS — E1165 Type 2 diabetes mellitus with hyperglycemia: Secondary | ICD-10-CM | POA: Diagnosis not present

## 2021-11-11 DIAGNOSIS — Z0000 Encounter for general adult medical examination without abnormal findings: Secondary | ICD-10-CM | POA: Diagnosis not present

## 2021-11-11 DIAGNOSIS — E663 Overweight: Secondary | ICD-10-CM | POA: Diagnosis not present

## 2021-11-11 DIAGNOSIS — Z23 Encounter for immunization: Secondary | ICD-10-CM | POA: Diagnosis not present

## 2021-11-11 DIAGNOSIS — K219 Gastro-esophageal reflux disease without esophagitis: Secondary | ICD-10-CM | POA: Diagnosis not present

## 2021-11-11 DIAGNOSIS — Z1331 Encounter for screening for depression: Secondary | ICD-10-CM | POA: Diagnosis not present

## 2021-12-23 DIAGNOSIS — M7752 Other enthesopathy of left foot: Secondary | ICD-10-CM | POA: Diagnosis not present

## 2021-12-23 DIAGNOSIS — M67472 Ganglion, left ankle and foot: Secondary | ICD-10-CM | POA: Diagnosis not present

## 2021-12-23 DIAGNOSIS — M79672 Pain in left foot: Secondary | ICD-10-CM | POA: Diagnosis not present

## 2022-02-26 DIAGNOSIS — E1165 Type 2 diabetes mellitus with hyperglycemia: Secondary | ICD-10-CM | POA: Diagnosis not present

## 2022-02-26 DIAGNOSIS — K219 Gastro-esophageal reflux disease without esophagitis: Secondary | ICD-10-CM | POA: Diagnosis not present

## 2022-02-26 DIAGNOSIS — Z6826 Body mass index (BMI) 26.0-26.9, adult: Secondary | ICD-10-CM | POA: Diagnosis not present

## 2022-02-26 DIAGNOSIS — E782 Mixed hyperlipidemia: Secondary | ICD-10-CM | POA: Diagnosis not present

## 2022-06-02 DIAGNOSIS — E1165 Type 2 diabetes mellitus with hyperglycemia: Secondary | ICD-10-CM | POA: Diagnosis not present

## 2022-06-02 DIAGNOSIS — E663 Overweight: Secondary | ICD-10-CM | POA: Diagnosis not present

## 2022-06-02 DIAGNOSIS — E782 Mixed hyperlipidemia: Secondary | ICD-10-CM | POA: Diagnosis not present

## 2022-06-02 DIAGNOSIS — Z6825 Body mass index (BMI) 25.0-25.9, adult: Secondary | ICD-10-CM | POA: Diagnosis not present

## 2022-06-08 DIAGNOSIS — M67472 Ganglion, left ankle and foot: Secondary | ICD-10-CM | POA: Diagnosis not present

## 2022-06-08 DIAGNOSIS — M79672 Pain in left foot: Secondary | ICD-10-CM | POA: Diagnosis not present

## 2022-10-01 DIAGNOSIS — R059 Cough, unspecified: Secondary | ICD-10-CM | POA: Diagnosis not present

## 2022-10-01 DIAGNOSIS — J069 Acute upper respiratory infection, unspecified: Secondary | ICD-10-CM | POA: Diagnosis not present

## 2023-01-15 ENCOUNTER — Encounter: Payer: Self-pay | Admitting: Gastroenterology

## 2023-03-04 DIAGNOSIS — B9689 Other specified bacterial agents as the cause of diseases classified elsewhere: Secondary | ICD-10-CM | POA: Diagnosis not present

## 2023-03-04 DIAGNOSIS — J329 Chronic sinusitis, unspecified: Secondary | ICD-10-CM | POA: Diagnosis not present

## 2023-03-08 DIAGNOSIS — J0140 Acute pansinusitis, unspecified: Secondary | ICD-10-CM | POA: Diagnosis not present

## 2023-04-19 DIAGNOSIS — M67472 Ganglion, left ankle and foot: Secondary | ICD-10-CM | POA: Diagnosis not present

## 2023-04-19 DIAGNOSIS — Z6824 Body mass index (BMI) 24.0-24.9, adult: Secondary | ICD-10-CM | POA: Diagnosis not present

## 2023-04-19 DIAGNOSIS — Z1331 Encounter for screening for depression: Secondary | ICD-10-CM | POA: Diagnosis not present

## 2023-04-19 DIAGNOSIS — E1165 Type 2 diabetes mellitus with hyperglycemia: Secondary | ICD-10-CM | POA: Diagnosis not present

## 2023-04-19 DIAGNOSIS — K219 Gastro-esophageal reflux disease without esophagitis: Secondary | ICD-10-CM | POA: Diagnosis not present

## 2023-04-19 DIAGNOSIS — R2242 Localized swelling, mass and lump, left lower limb: Secondary | ICD-10-CM | POA: Diagnosis not present

## 2023-04-19 DIAGNOSIS — E782 Mixed hyperlipidemia: Secondary | ICD-10-CM | POA: Diagnosis not present

## 2023-04-19 DIAGNOSIS — M79672 Pain in left foot: Secondary | ICD-10-CM | POA: Diagnosis not present

## 2023-04-19 DIAGNOSIS — Z0000 Encounter for general adult medical examination without abnormal findings: Secondary | ICD-10-CM | POA: Diagnosis not present

## 2023-04-20 ENCOUNTER — Encounter (INDEPENDENT_AMBULATORY_CARE_PROVIDER_SITE_OTHER): Payer: Self-pay | Admitting: *Deleted

## 2023-04-20 DIAGNOSIS — Z0000 Encounter for general adult medical examination without abnormal findings: Secondary | ICD-10-CM | POA: Diagnosis not present

## 2023-04-20 DIAGNOSIS — E1165 Type 2 diabetes mellitus with hyperglycemia: Secondary | ICD-10-CM | POA: Diagnosis not present

## 2023-05-10 DIAGNOSIS — M67472 Ganglion, left ankle and foot: Secondary | ICD-10-CM | POA: Diagnosis not present

## 2023-10-21 ENCOUNTER — Encounter (INDEPENDENT_AMBULATORY_CARE_PROVIDER_SITE_OTHER): Payer: Self-pay | Admitting: *Deleted

## 2023-11-26 ENCOUNTER — Encounter: Payer: Self-pay | Admitting: Orthopedic Surgery

## 2023-11-26 ENCOUNTER — Other Ambulatory Visit: Payer: Self-pay

## 2023-11-26 ENCOUNTER — Ambulatory Visit (INDEPENDENT_AMBULATORY_CARE_PROVIDER_SITE_OTHER): Payer: Self-pay | Admitting: Orthopedic Surgery

## 2023-11-26 VITALS — BP 151/82 | HR 92 | Ht 68.0 in | Wt 178.0 lb

## 2023-11-26 DIAGNOSIS — M67472 Ganglion, left ankle and foot: Secondary | ICD-10-CM

## 2023-11-26 NOTE — Progress Notes (Signed)
 New Patient Visit  Assessment: Keith Lam is a 58 y.o. male with the following: 1. Ganglion cyst of left foot  Plan: CID AGENA has what appears to be a ganglion cyst in the lateral aspect of the left foot.  He has had this drained multiple times, and it has consisted of clear gelatinous fluid.  The growth has been evaluated in more detail, and there is no concern for malignancy.  Ultimately, he would like to have it removed.  We discussed what this would entail, and I recommended surgery in order to remove the entirety of the cyst pocket.  He states understanding.  He is not ready to proceed with surgery yet, as he is busy at work.  We discussed the procedure in great detail.  He will call the clinic if he wishes to proceed with surgery.  He is anticipating he could be in a position to proceed with surgery about a month.  If there is a delay, I would likely ask him to return to clinic, so we can reassess.  He states understanding.  Follow-up as needed.  Follow-up: Return if symptoms worsen or fail to improve.  Subjective:  Chief Complaint  Patient presents with   Foot Pain    Ganglion cyst left foot and has had it drained 5 times  it hurts at times      History of Present Illness: Keith Lam is a 58 y.o. male who has been referred by  Rogers Blocker, PA-C for evaluation of left foot pain.  He states he had a growth on his left foot for a couple of years.  He has seen multiple different providers, and had it drained is much as of 5 different times.  He notes a clear gelatinous fluid has been drained every time.  He has been told this is a ganglion cyst.  They have evaluated it more detail, but were not concerned about any type of cancer.  Occasionally, it will get smaller, and then return.  No change in his health.  No redness.  He has not noticed any overlying skin changes.  Because of its location on the lateral side of the foot, certain shoewear will make things  worse.   Review of Systems: No fevers or chills No numbness or tingling No chest pain No shortness of breath No bowel or bladder dysfunction No GI distress No headaches   Medical History:  Past Medical History:  Diagnosis Date   Acid reflux    Allergy    Cancer (HCC)    skin cancer scalp   Diabetes mellitus without complication (HCC)    PONV (postoperative nausea and vomiting)     Past Surgical History:  Procedure Laterality Date   COLONOSCOPY  01/26/2020   SINUS EXPLORATION     TONSILLECTOMY      Family History  Problem Relation Age of Onset   Diabetes Paternal Aunt    Diabetes Paternal Uncle    Hypertension Mother    Heart failure Father    Lung cancer Father    Colon cancer Neg Hx    Colon polyps Neg Hx    Esophageal cancer Neg Hx    Rectal cancer Neg Hx    Stomach cancer Neg Hx    Social History   Tobacco Use   Smoking status: Former   Smokeless tobacco: Former  Substance Use Topics   Alcohol use: Yes    Alcohol/week: 14.0 standard drinks of alcohol    Types: 14  Cans of beer per week   Drug use: No    Allergies  Allergen Reactions   Sulfa Antibiotics    Sulfasalazine     Current Meds  Medication Sig   aspirin EC 81 MG tablet Take 81 mg by mouth daily.   cetirizine (ZYRTEC) 10 MG tablet Take 10 mg by mouth daily.   cholecalciferol (VITAMIN D) 1000 units tablet Take 1,000 Units by mouth daily.   Cyanocobalamin (VITAMIN B 12 PO) Take by mouth.   Flaxseed, Linseed, (FLAXSEED OIL PO) Take by mouth.   omeprazole (PRILOSEC) 20 MG capsule Take 20 mg by mouth daily.   tadalafil (CIALIS) 5 MG tablet Take 5 mg by mouth daily.    Objective: BP (!) 151/82   Pulse 92   Ht 5\' 8"  (1.727 m)   Wt 178 lb (80.7 kg)   BMI 27.06 kg/m   Physical Exam:  General: Alert and oriented. and No acute distress. Gait: Normal gait.  Evaluation of left foot demonstrates some swelling on the lateral midfoot area.  No tenderness to palpation in this area.  No  overlying skin changes.  The growth is less than 1 cm, and compressible.  The skin is mobile.  Remainder of the foot is without abnormality.  Toes warm and well-perfused.   IMAGING: I personally ordered and reviewed the following images  X-rays of the left foot were obtained.  There is a marker overlying the swelling.  No acute injuries.  No dislocation.  Minimal degenerative changes.  There may be some reactive bone in the area of the marker, consistent with chronic pressure.  No callus formation, or other bony lesion.  Impression: Negative left foot x-ray   New Medications:  No orders of the defined types were placed in this encounter.     Oliver Barre, MD  11/26/2023 11:52 AM

## 2023-11-26 NOTE — Patient Instructions (Signed)
 Please call the office if you are interested in having the cyst removed from the left foot
